# Patient Record
Sex: Female | Born: 1990 | Race: Black or African American | Hispanic: No | Marital: Single | Smoking: Never smoker
Health system: Southern US, Community
[De-identification: ages and names within clinical notes are randomized; demographics above are authoritative.]

## PROBLEM LIST (undated history)

## (undated) DIAGNOSIS — Z789 Other specified health status: Secondary | ICD-10-CM

## (undated) HISTORY — DX: Other specified health status: Z78.9

## (undated) HISTORY — PX: OTHER SURGICAL HISTORY: SHX169

---

## 2012-08-15 ENCOUNTER — Other Ambulatory Visit: Payer: Self-pay

## 2012-08-18 ENCOUNTER — Other Ambulatory Visit (HOSPITAL_COMMUNITY): Payer: Self-pay | Admitting: Obstetrics and Gynecology

## 2012-08-18 DIAGNOSIS — IMO0002 Reserved for concepts with insufficient information to code with codable children: Secondary | ICD-10-CM

## 2012-08-18 DIAGNOSIS — O358XX0 Maternal care for other (suspected) fetal abnormality and damage, not applicable or unspecified: Secondary | ICD-10-CM

## 2012-08-18 DIAGNOSIS — Q308 Other congenital malformations of nose: Secondary | ICD-10-CM

## 2012-08-21 ENCOUNTER — Ambulatory Visit (HOSPITAL_COMMUNITY)
Admission: RE | Admit: 2012-08-21 | Discharge: 2012-08-21 | Disposition: A | Discharge status: Home / Self Care | Payer: Medicaid Other | Source: Ambulatory Visit | Attending: Obstetrics and Gynecology | Admitting: Obstetrics and Gynecology

## 2012-08-21 ENCOUNTER — Encounter (HOSPITAL_COMMUNITY): Payer: Self-pay

## 2012-08-21 DIAGNOSIS — Z363 Encounter for antenatal screening for malformations: Secondary | ICD-10-CM | POA: Insufficient documentation

## 2012-08-21 DIAGNOSIS — Z1389 Encounter for screening for other disorder: Secondary | ICD-10-CM | POA: Insufficient documentation

## 2012-08-21 DIAGNOSIS — O358XX0 Maternal care for other (suspected) fetal abnormality and damage, not applicable or unspecified: Secondary | ICD-10-CM | POA: Insufficient documentation

## 2012-08-21 DIAGNOSIS — Q308 Other congenital malformations of nose: Secondary | ICD-10-CM

## 2012-08-22 NOTE — Progress Notes (Signed)
Genetic Counseling  High-Risk Gestation Note  Appointment Date:  08/21/2012 Referred By: Sherron Monday, MD Date of Birth:  [birth year]    Pregnancy History: G1P0000 Estimated Date of Delivery: 01/02/13 Estimated Gestational Age: [redacted]w[redacted]d Attending: Particia Nearing, MD   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 She was accompanied today by a friend.   We began by reviewing the ultrasound results in detail. 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 Complete ultrasound report sent separately.   An absent or hypoplastic (undeveloped, or slightly smaller than expected) nasal bone is commonly a normal variation with no associated problems. This may be a family trait and can be more common in some ethnic groups, such as the African American population.  However, an absent or hypoplastic nasal bone has been shown to increase the chance for Down syndrome in a pregnancy.   We discussed that an echogenic bowel refers to an area in the fetal intestines that appears brighter, or denser, than the liver and/or bone on ultrasound. Echogenic bowel is detected in approximately 1% of fetuses at the time of a second trimester ultrasound evaluation.  Most of the time, this brightness does not cause any problems and will spontaneously resolve.  Possible causes of an echogenic bowel include undergrowth of the bowel, an obstruction or blockage of the intestines, the fetus swallowing a small amount of blood present in the amniotic fluid, an intrauterine infection, a chromosome condition, cystic fibrosis, or a normal variation in development.   [name] reported no bleeding during the pregnancy. She also denied known exposure to infections during the pregnancy. We discussed the option of analyzing maternal TORCH titers to assess for evidence of recent infection in pregnancy.  We reviewed the limitations of this testing. We briefly reviewed cystic fibrosis (CF) including the typical features and prognosis and the autosomal recessive inheritance of the condition. The carrier frequency in the African American population is approximately 1 in 28.  [name] OB medical records indicate that she previously had negative/normal CF carrier screening for 97 mutations through her OB office, which reduced her risk to be a carrier to approximately 1 in 76. We reviewed that a normal screen reduces but does not eliminate the chance to be a carrier.  Thus, her risk to be a carrier and the risk for CF in the pregnancy have been reduced.   We reviewed chromosomes, nondisjunction, and the common and variable features and prognosis of Down syndrome (trisomy 21). We reviewed the results of [name]'s Quad screen through [employer], which reduced the risk for Down syndrome to 1 in 1593. The patient understands that Quad screen adjusts the a priori risk for the conditions screened but does not diagnose or rule out these conditions. We discussed that the ultrasound findings of absent nasal bone and echogenic bowel would increase the chance for fetal Down syndrome from the patient's Quad screen result of 1 in 1593 to approximately 0.6%. We reviewed other available screening and diagnostic options including noninvasive prenatal testing (NIPT) and amniocentesis.  Specifically, we discussed that noninvasive prenatal testing (NIPT) utilizes cell free fetal DNA found in the maternal circulation. This test is not diagnostic for chromosome conditions, but can provide information regarding the presence or absence of extra fetal DNA for chromosomes 13, 18 and 21. Thus, it would not identify or rule out all fetal aneuploidy. The reported detection rate is greater than  99% for Trisomy 21, greater than 97% for Trisomy 18, and is approximately 80% (8 out of 10) for Trisomy 13. The false positive rate is reported to be  less than 1% for any of these conditions.  We discussed the diagnostic testing option of amniocentesis. We reviewed the approximate 1 in 300-500 risk for complications, including spontaneous pregnancy loss.  We reviewed that in addition to chromosome analysis, infection studies can also be performed on amniotic fluid. We discussed the risks, limitations, and benefits of each.   After consideration of all the options, declined additional testing at the time of today's visit. She declined amniocentesis, stating she was not interested in pursuing amniocentesis at this time or in the future given the associated risk of complications. She planned to further consider NIPT ([facility]) and TORCH titers and will call our office back should she elect to pursue this testing.  She understands that ultrasound and screening cannot rule out all birth defects or genetic syndromes.    [name] was provided with written information regarding sickle cell anemia (SCA) including the carrier frequency and incidence in the African-American population, the availability of carrier testing and prenatal diagnosis if indicated.  In addition, we discussed that hemoglobinopathies are routinely screened for as part of the [place] newborn screening panel.  Hemoglobin electrophoresis was performed through her OB office and was normal, indicating that the patient does not have sickle cell trait.   Both family histories were reviewed and found to be noncontributory for birth defects, mental retardation, and known genetic conditions. Without further information regarding the provided family history, an accurate genetic risk cannot be calculated. Further genetic counseling is warranted if more information is obtained.  [name] denied exposure to environmental toxins or chemical agents. She reported a chest x ray in late June/early July at [facility] given an enlarged lymph node. She reported that it was suspected that she had an infection but  the etiology was not determined. She reported that her abdomen was shielded during the x ray. The dose of ionizing radiation is an important factor in determining the potential toxicity to a pregnancy. Available study data suggest x ray exposure at 5 rad or less is not associated with an increased risk for birth defects. The typical dose from one chest x ray is typically below this threshold. However, additional information regarding the amount of radiation used during this particular study is needed to accurately assess risk to pregnancy. She denied the use of alcohol, tobacco or street drugs. She denied significant viral illnesses during the Malone of her pregnancy. Her medical and surgical histories were otherwise noncontributory.     I counseled [name] regarding the above risks and available options.  The approximate face-to-face time with the genetic counselor was 35 minutes.    Quinn Plowman, MS,  Certified Genetic Counselor 08/22/2012

## 2014-10-25 ENCOUNTER — Encounter (HOSPITAL_COMMUNITY): Payer: Self-pay

## 2018-12-24 NOTE — L&D Delivery Note (Signed)
Delivery Note At 3:27 AM a viable female was delivered via  (Presentation: ROA ).  APGAR: 7, 9; weight 8 lb 10 oz (3912 g).   Placenta status:L&D .  Cord:  with the following complications: none .  Cord pH: n/a  Anesthesia:  epidural Episiotomy:  none Lacerations:  1st degree Suture Repair: 4-0 vicryl Est. Blood Loss (mL):  100cc  Mom to postpartum.  Baby to Couplet care / Skin to Skin.  Annalee Genta 06/16/2019, 3:49 AM

## 2019-01-09 LAB — OB RESULTS CONSOLE ANTIBODY SCREEN: Antibody Screen: NEGATIVE

## 2019-01-09 LAB — OB RESULTS CONSOLE HEPATITIS B SURFACE ANTIGEN: Hepatitis B Surface Ag: NEGATIVE

## 2019-01-09 LAB — OB RESULTS CONSOLE RUBELLA ANTIBODY, IGM: Rubella: IMMUNE

## 2019-01-09 LAB — OB RESULTS CONSOLE RPR: RPR: NONREACTIVE

## 2019-01-09 LAB — OB RESULTS CONSOLE ABO/RH: RH Type: POSITIVE

## 2019-01-09 LAB — OB RESULTS CONSOLE HIV ANTIBODY (ROUTINE TESTING): HIV: NONREACTIVE

## 2019-02-02 ENCOUNTER — Other Ambulatory Visit (HOSPITAL_COMMUNITY): Payer: Self-pay | Admitting: Obstetrics & Gynecology

## 2019-02-02 DIAGNOSIS — Z3A23 23 weeks gestation of pregnancy: Secondary | ICD-10-CM

## 2019-02-02 DIAGNOSIS — Z363 Encounter for antenatal screening for malformations: Secondary | ICD-10-CM

## 2019-02-02 DIAGNOSIS — O283 Abnormal ultrasonic finding on antenatal screening of mother: Secondary | ICD-10-CM

## 2019-03-04 ENCOUNTER — Other Ambulatory Visit (HOSPITAL_COMMUNITY): Payer: Self-pay | Admitting: *Deleted

## 2019-03-04 ENCOUNTER — Encounter (HOSPITAL_COMMUNITY): Payer: Self-pay

## 2019-03-04 ENCOUNTER — Other Ambulatory Visit: Payer: Self-pay

## 2019-03-04 ENCOUNTER — Ambulatory Visit (HOSPITAL_COMMUNITY)
Admission: RE | Admit: 2019-03-04 | Discharge: 2019-03-04 | Disposition: A | Discharge status: Home / Self Care | Payer: BC Managed Care – PPO | Source: Ambulatory Visit | Attending: Obstetrics & Gynecology | Admitting: Obstetrics & Gynecology

## 2019-03-04 ENCOUNTER — Ambulatory Visit (HOSPITAL_COMMUNITY): Payer: BC Managed Care – PPO | Admitting: *Deleted

## 2019-03-04 ENCOUNTER — Ambulatory Visit (HOSPITAL_COMMUNITY): Payer: BC Managed Care – PPO

## 2019-03-04 VITALS — BP 118/71 | HR 105 | Ht 66.5 in | Wt 152.6 lb

## 2019-03-04 DIAGNOSIS — O283 Abnormal ultrasonic finding on antenatal screening of mother: Secondary | ICD-10-CM

## 2019-03-04 DIAGNOSIS — O359XX0 Maternal care for (suspected) fetal abnormality and damage, unspecified, not applicable or unspecified: Secondary | ICD-10-CM

## 2019-03-04 DIAGNOSIS — Z363 Encounter for antenatal screening for malformations: Secondary | ICD-10-CM | POA: Insufficient documentation

## 2019-03-04 DIAGNOSIS — Z3A23 23 weeks gestation of pregnancy: Secondary | ICD-10-CM | POA: Diagnosis present

## 2019-03-04 DIAGNOSIS — N281 Cyst of kidney, acquired: Secondary | ICD-10-CM

## 2019-03-04 DIAGNOSIS — O0990 Supervision of high risk pregnancy, unspecified, unspecified trimester: Secondary | ICD-10-CM | POA: Insufficient documentation

## 2019-04-02 ENCOUNTER — Ambulatory Visit (HOSPITAL_COMMUNITY): Payer: Medicaid Other

## 2019-04-02 ENCOUNTER — Encounter (HOSPITAL_COMMUNITY): Payer: Self-pay

## 2019-05-05 ENCOUNTER — Ambulatory Visit (HOSPITAL_COMMUNITY): Payer: BC Managed Care – PPO

## 2019-05-12 ENCOUNTER — Ambulatory Visit (HOSPITAL_COMMUNITY)
Admission: RE | Admit: 2019-05-12 | Discharge: 2019-05-12 | Disposition: A | Discharge status: Home / Self Care | Payer: BC Managed Care – PPO | Source: Ambulatory Visit | Attending: Obstetrics and Gynecology | Admitting: Obstetrics and Gynecology

## 2019-05-12 ENCOUNTER — Encounter (HOSPITAL_COMMUNITY): Payer: Self-pay | Admitting: *Deleted

## 2019-05-12 ENCOUNTER — Other Ambulatory Visit: Payer: Self-pay

## 2019-05-12 ENCOUNTER — Other Ambulatory Visit (HOSPITAL_COMMUNITY): Payer: Self-pay | Admitting: *Deleted

## 2019-05-12 ENCOUNTER — Ambulatory Visit (HOSPITAL_COMMUNITY): Payer: BC Managed Care – PPO | Admitting: *Deleted

## 2019-05-12 VITALS — Temp 98.2°F

## 2019-05-12 DIAGNOSIS — Z3A33 33 weeks gestation of pregnancy: Secondary | ICD-10-CM | POA: Diagnosis not present

## 2019-05-12 DIAGNOSIS — O359XX0 Maternal care for (suspected) fetal abnormality and damage, unspecified, not applicable or unspecified: Secondary | ICD-10-CM

## 2019-05-12 DIAGNOSIS — N281 Cyst of kidney, acquired: Secondary | ICD-10-CM

## 2019-05-12 DIAGNOSIS — Z362 Encounter for other antenatal screening follow-up: Secondary | ICD-10-CM

## 2019-05-12 DIAGNOSIS — O283 Abnormal ultrasonic finding on antenatal screening of mother: Secondary | ICD-10-CM | POA: Diagnosis present

## 2019-06-09 ENCOUNTER — Telehealth (HOSPITAL_COMMUNITY): Payer: Self-pay | Admitting: *Deleted

## 2019-06-09 ENCOUNTER — Encounter (HOSPITAL_COMMUNITY): Payer: Self-pay | Admitting: *Deleted

## 2019-06-09 LAB — OB RESULTS CONSOLE GBS: GBS: NEGATIVE

## 2019-06-09 NOTE — Telephone Encounter (Signed)
Preadmission screen  

## 2019-06-10 ENCOUNTER — Encounter (HOSPITAL_COMMUNITY): Payer: Self-pay | Admitting: *Deleted

## 2019-06-10 ENCOUNTER — Ambulatory Visit (HOSPITAL_COMMUNITY): Payer: BC Managed Care – PPO

## 2019-06-10 ENCOUNTER — Encounter (HOSPITAL_COMMUNITY): Payer: Self-pay

## 2019-06-15 ENCOUNTER — Inpatient Hospital Stay (HOSPITAL_COMMUNITY)
Admission: AD | Admit: 2019-06-15 | Discharge: 2019-06-18 | DRG: 807 | Disposition: A | Discharge status: Home / Self Care | Payer: BC Managed Care – PPO | Attending: Obstetrics & Gynecology | Admitting: Obstetrics & Gynecology

## 2019-06-15 ENCOUNTER — Other Ambulatory Visit: Payer: Self-pay

## 2019-06-15 DIAGNOSIS — Z3A38 38 weeks gestation of pregnancy: Secondary | ICD-10-CM | POA: Diagnosis not present

## 2019-06-15 DIAGNOSIS — D649 Anemia, unspecified: Secondary | ICD-10-CM | POA: Diagnosis present

## 2019-06-15 DIAGNOSIS — O9902 Anemia complicating childbirth: Secondary | ICD-10-CM | POA: Diagnosis present

## 2019-06-15 DIAGNOSIS — Z1159 Encounter for screening for other viral diseases: Secondary | ICD-10-CM

## 2019-06-15 DIAGNOSIS — O26893 Other specified pregnancy related conditions, third trimester: Secondary | ICD-10-CM | POA: Diagnosis present

## 2019-06-15 DIAGNOSIS — O358XX0 Maternal care for other (suspected) fetal abnormality and damage, not applicable or unspecified: Secondary | ICD-10-CM | POA: Diagnosis present

## 2019-06-15 NOTE — MAU Note (Signed)
Pt c/o contractions that started at 2100. Now 5 mins apart, denies LOF or VB . +FM. Was closed at last PNV.

## 2019-06-16 ENCOUNTER — Inpatient Hospital Stay (HOSPITAL_COMMUNITY): Payer: BC Managed Care – PPO | Admitting: Anesthesiology

## 2019-06-16 ENCOUNTER — Encounter (HOSPITAL_COMMUNITY): Payer: Self-pay | Admitting: Anesthesiology

## 2019-06-16 LAB — CBC
HCT: 35.3 % — ABNORMAL LOW (ref 36.0–46.0)
Hemoglobin: 11.5 g/dL — ABNORMAL LOW (ref 12.0–15.0)
MCH: 30 pg (ref 26.0–34.0)
MCHC: 32.6 g/dL (ref 30.0–36.0)
MCV: 92.2 fL (ref 80.0–100.0)
Platelets: 219 10*3/uL (ref 150–400)
RBC: 3.83 MIL/uL — ABNORMAL LOW (ref 3.87–5.11)
RDW: 14.5 % (ref 11.5–15.5)
WBC: 9.3 10*3/uL (ref 4.0–10.5)
nRBC: 0 % (ref 0.0–0.2)

## 2019-06-16 LAB — RPR: RPR Ser Ql: NONREACTIVE

## 2019-06-16 LAB — ABO/RH: ABO/RH(D): A POS

## 2019-06-16 LAB — TYPE AND SCREEN
ABO/RH(D): A POS
Antibody Screen: NEGATIVE

## 2019-06-16 LAB — SARS CORONAVIRUS 2 BY RT PCR (HOSPITAL ORDER, PERFORMED IN ~~LOC~~ HOSPITAL LAB): SARS Coronavirus 2: NEGATIVE

## 2019-06-16 MED ORDER — ONDANSETRON HCL 4 MG PO TABS: 4.0000 mg | ORAL_TABLET | ORAL | Status: DC | PRN

## 2019-06-16 MED ORDER — PRENATAL MULTIVITAMIN CH
1.0000 | ORAL_TABLET | Freq: Every day | ORAL | Status: DC
  Administered 2019-06-16 – 2019-06-18 (×3): 1 via ORAL
  Filled 2019-06-16 (×3): qty 1

## 2019-06-16 MED ORDER — LIDOCAINE HCL (PF) 1 % IJ SOLN
INTRAMUSCULAR | Status: DC | PRN
  Administered 2019-06-16 (×2): 4 mL via EPIDURAL

## 2019-06-16 MED ORDER — ONDANSETRON HCL 4 MG/2ML IJ SOLN: 4.0000 mg | Freq: Four times a day (QID) | INTRAMUSCULAR | Status: DC | PRN

## 2019-06-16 MED ORDER — LIDOCAINE HCL (PF) 1 % IJ SOLN: 30.0000 mL | INTRAMUSCULAR | Status: DC | PRN

## 2019-06-16 MED ORDER — DIPHENHYDRAMINE HCL 50 MG/ML IJ SOLN: 12.5000 mg | INTRAMUSCULAR | Status: DC | PRN

## 2019-06-16 MED ORDER — DIPHENHYDRAMINE HCL 25 MG PO CAPS: 25.0000 mg | ORAL_CAPSULE | Freq: Four times a day (QID) | ORAL | Status: DC | PRN

## 2019-06-16 MED ORDER — SOD CITRATE-CITRIC ACID 500-334 MG/5ML PO SOLN: 30.0000 mL | ORAL | Status: DC | PRN

## 2019-06-16 MED ORDER — WITCH HAZEL-GLYCERIN EX PADS: 1.0000 "application " | MEDICATED_PAD | CUTANEOUS | Status: DC | PRN

## 2019-06-16 MED ORDER — COCONUT OIL OIL: 1.0000 "application " | TOPICAL_OIL | Status: DC | PRN

## 2019-06-16 MED ORDER — LACTATED RINGERS IV SOLN
500.0000 mL | Freq: Once | INTRAVENOUS | Status: AC
  Administered 2019-06-16: 01:00:00 500 mL via INTRAVENOUS

## 2019-06-16 MED ORDER — SENNOSIDES-DOCUSATE SODIUM 8.6-50 MG PO TABS
2.0000 | ORAL_TABLET | ORAL | Status: DC
  Administered 2019-06-16 – 2019-06-18 (×2): 2 via ORAL
  Filled 2019-06-16 (×2): qty 2

## 2019-06-16 MED ORDER — OXYTOCIN 40 UNITS IN NORMAL SALINE INFUSION - SIMPLE MED
INTRAVENOUS | Status: AC
  Filled 2019-06-16: qty 1000

## 2019-06-16 MED ORDER — ACETAMINOPHEN 325 MG PO TABS
650.0000 mg | ORAL_TABLET | ORAL | Status: DC | PRN
  Administered 2019-06-17 – 2019-06-18 (×2): 650 mg via ORAL
  Filled 2019-06-16 (×2): qty 2

## 2019-06-16 MED ORDER — OXYCODONE-ACETAMINOPHEN 5-325 MG PO TABS: 1.0000 | ORAL_TABLET | ORAL | Status: DC | PRN

## 2019-06-16 MED ORDER — OXYCODONE-ACETAMINOPHEN 5-325 MG PO TABS: 2.0000 | ORAL_TABLET | ORAL | Status: DC | PRN

## 2019-06-16 MED ORDER — BUTORPHANOL TARTRATE 1 MG/ML IJ SOLN
INTRAMUSCULAR | Status: AC
  Filled 2019-06-16: qty 1

## 2019-06-16 MED ORDER — PHENYLEPHRINE 40 MCG/ML (10ML) SYRINGE FOR IV PUSH (FOR BLOOD PRESSURE SUPPORT): 80.0000 ug | PREFILLED_SYRINGE | INTRAVENOUS | Status: DC | PRN

## 2019-06-16 MED ORDER — FLEET ENEMA 7-19 GM/118ML RE ENEM: 1.0000 | ENEMA | RECTAL | Status: DC | PRN

## 2019-06-16 MED ORDER — SODIUM CHLORIDE (PF) 0.9 % IJ SOLN
INTRAMUSCULAR | Status: DC | PRN
  Administered 2019-06-16: 12 mL/h via EPIDURAL

## 2019-06-16 MED ORDER — EPHEDRINE 5 MG/ML INJ: 10.0000 mg | INTRAVENOUS | Status: DC | PRN

## 2019-06-16 MED ORDER — BUTORPHANOL TARTRATE 1 MG/ML IJ SOLN
1.0000 mg | Freq: Once | INTRAMUSCULAR | Status: AC
  Administered 2019-06-16: 01:00:00 1 mg via INTRAVENOUS

## 2019-06-16 MED ORDER — ZOLPIDEM TARTRATE 5 MG PO TABS: 5.0000 mg | ORAL_TABLET | Freq: Every evening | ORAL | Status: DC | PRN

## 2019-06-16 MED ORDER — DIBUCAINE (PERIANAL) 1 % EX OINT: 1.0000 "application " | TOPICAL_OINTMENT | CUTANEOUS | Status: DC | PRN

## 2019-06-16 MED ORDER — LACTATED RINGERS IV SOLN: 500.0000 mL | Freq: Once | INTRAVENOUS | Status: DC

## 2019-06-16 MED ORDER — ONDANSETRON HCL 4 MG/2ML IJ SOLN: 4.0000 mg | INTRAMUSCULAR | Status: DC | PRN

## 2019-06-16 MED ORDER — OXYTOCIN 40 UNITS IN NORMAL SALINE INFUSION - SIMPLE MED: 2.5000 [IU]/h | INTRAVENOUS | Status: DC

## 2019-06-16 MED ORDER — ACETAMINOPHEN 325 MG PO TABS: 650.0000 mg | ORAL_TABLET | ORAL | Status: DC | PRN

## 2019-06-16 MED ORDER — IBUPROFEN 600 MG PO TABS
600.0000 mg | ORAL_TABLET | Freq: Four times a day (QID) | ORAL | Status: DC
  Administered 2019-06-16 – 2019-06-18 (×9): 600 mg via ORAL
  Filled 2019-06-16 (×9): qty 1

## 2019-06-16 MED ORDER — LACTATED RINGERS IV SOLN
INTRAVENOUS | Status: DC
  Administered 2019-06-16 (×3): via INTRAVENOUS

## 2019-06-16 MED ORDER — LACTATED RINGERS IV SOLN
500.0000 mL | INTRAVENOUS | Status: DC | PRN
  Administered 2019-06-16: 500 mL via INTRAVENOUS

## 2019-06-16 MED ORDER — SIMETHICONE 80 MG PO CHEW: 80.0000 mg | CHEWABLE_TABLET | ORAL | Status: DC | PRN

## 2019-06-16 MED ORDER — FENTANYL-BUPIVACAINE-NACL 0.5-0.125-0.9 MG/250ML-% EP SOLN: 12.0000 mL/h | EPIDURAL | Status: DC | PRN

## 2019-06-16 MED ORDER — OXYTOCIN BOLUS FROM INFUSION
500.0000 mL | Freq: Once | INTRAVENOUS | Status: AC
  Administered 2019-06-16: 500 mL via INTRAVENOUS

## 2019-06-16 MED ORDER — BENZOCAINE-MENTHOL 20-0.5 % EX AERO: 1.0000 "application " | INHALATION_SPRAY | CUTANEOUS | Status: DC | PRN

## 2019-06-16 MED ORDER — FENTANYL-BUPIVACAINE-NACL 0.5-0.125-0.9 MG/250ML-% EP SOLN
EPIDURAL | Status: AC
  Filled 2019-06-16: qty 250

## 2019-06-16 NOTE — Progress Notes (Signed)
OB PN:  S: s/p epidural pain improved from 9/10 to 5/10- still feeling some contractions  O: BP 117/76   Pulse 74   Temp 97.8 F (36.6 C) (Oral)   Resp 20   Ht 5\' 7"  (1.702 m)   Wt 82.3 kg   LMP 09/20/2018   SpO2 100%   BMI 28.41 kg/m   FHT: 125bpm, moderate variablity, + accels, variable decel x 1 with AROM- no further decels noted Toco: q39min SVE: 9/100/-1  A/P: 28 y.o. G2P1001 @ [redacted]w[redacted]d in active labor 1. FWB: Cat.I 2. Labor: expectant management Pain: continue epidural GBS: negative  Janyth Pupa, DO [phone] (cell) [phone] (office)

## 2019-06-16 NOTE — Anesthesia Procedure Notes (Signed)
Epidural Patient location during procedure: OB Start time: 06/16/2019 1:03 AM End time: 06/16/2019 1:11 AM  Staffing Anesthesiologist: Josephine Igo, MD Performed: anesthesiologist   Preanesthetic Checklist Completed: patient identified, site marked, surgical consent, pre-op evaluation, timeout performed, IV checked, risks and benefits discussed and monitors and equipment checked  Epidural Patient position: sitting Prep: site prepped and draped and DuraPrep Patient monitoring: continuous pulse ox and blood pressure Approach: midline Location: L3-L4 Injection technique: LOR air  Needle:  Needle type: Tuohy  Needle gauge: 17 G Needle length: 9 cm and 9 Needle insertion depth: 5 cm cm Catheter type: closed end flexible Catheter size: 19 Gauge Catheter at skin depth: 10 cm Test dose: negative and Other  Assessment Events: blood not aspirated, injection not painful, no injection resistance, negative IV test and no paresthesia  Additional Notes Patient identified. Risks and benefits discussed including failed block, incomplete  Pain control, post dural puncture headache, nerve damage, paralysis, blood pressure Changes, nausea, vomiting, reactions to medications-both toxic and allergic and post Partum back pain. All questions were answered. Patient expressed understanding and wished to proceed. Sterile technique was used throughout procedure. Epidural site was Dressed with sterile barrier dressing. No paresthesias, signs of intravascular injection Or signs of intrathecal spread were encountered.  Patient was more comfortable after the epidural was dosed. Please see RN's note for documentation of vital signs and FHR which are stable. Reason for block:procedure for pain

## 2019-06-16 NOTE — H&P (Signed)
HPI: 28 y/o G2P1001 @ [redacted]w[redacted]d estimated gestational age (as dated by LMP c/w 20 week ultrasound) presents for painful contractions.   no Leaking of Fluid,   no Vaginal Bleeding,   + Uterine Contractions,  + Fetal Movement.  Prenatal care has been provided by Dr. Nelda Marseille  ROS: no HA, no epigastric pain, no visual changes.    Pregnancy complicated by: 1) Fetal renal cyst- followed by serial [place] and MFM -last [place] @ [redacted]w[redacted]d-vertex, EFW 8#2oz (>90%), Left renal cyst 7.7cm   Prenatal Transfer Tool  Maternal Diabetes: No Genetic Screening: Normal Maternal Ultrasounds/Referrals: Normal Fetal Ultrasounds or other Referrals:  Referred to Materal Fetal Medicine  Maternal Substance Abuse:  No Significant Maternal Medications:  None Significant Maternal Lab Results: Group B Strep negative   PNL:  GBS neg, Rub Immune, Hep B neg, RPR NR, HIV neg, GC/C neg, glucola:normal Hgb: Blood type: A positive, antibody neg  Immunizations: Tdap: 04/01/2019 Declined flu shot  OBHx: 2004- FTSNVD, 7#12oz PMHx:  none Meds:  PNV Allergy:  No Known Allergies SurgHx: none SocHx:   no Tobacco, no  EtOH, no Illicit Drugs  O: BP 681/15 (BP Location: Right Arm)   Pulse 75   Temp 98.4 F (36.9 C) (Oral)   Resp 18   Ht 5\' 7"  (1.702 m)   Wt 82.3 kg   LMP 09/20/2018   SpO2 99%   BMI 28.41 kg/m  Gen. AAOx3, NAD CV.  RRR  No murmur.  Resp. CTAB, no wheeze or crackles. Abd. Gravid,  no tenderness,  no rigidity,  no guarding Extr.  no edema B/L , no calf tenderness, neg Homan's B/L FHT: 120 baseline, moderate variability, + accels,  no decels Toco: q 2 min SVE: deferred, per MAU 7-8cm   Labs: see orders  A/P:  28 y.o. G2P1001 @ [redacted]w[redacted]d EGA admitted in active labor -FWB:  NICHD Cat I FHTs -Labor: expectant management -GBS: negative -Pain management: IV x 1 now, plan for epidural -Fetal renal cyst- will notify Crisman, DO [phone] (cell) [phone] (office)

## 2019-06-16 NOTE — Anesthesia Preprocedure Evaluation (Signed)
Anesthesia Evaluation  Patient identified by MRN, date of birth, ID band Patient awake    Reviewed: Allergy & Precautions, Patient's Chart, lab work & pertinent test results  Airway Mallampati: II  TM Distance: >3 FB Neck ROM: Full    Dental no notable dental hx. (+) Teeth Intact   Pulmonary neg pulmonary ROS,    Pulmonary exam normal breath sounds clear to auscultation       Cardiovascular negative cardio ROS Normal cardiovascular exam Rhythm:Regular Rate:Normal     Neuro/Psych negative neurological ROS  negative psych ROS   GI/Hepatic Neg liver ROS, GERD  ,  Endo/Other  negative endocrine ROS  Renal/GU negative Renal ROS  negative genitourinary   Musculoskeletal negative musculoskeletal ROS (+)   Abdominal   Peds  Hematology  (+) anemia ,   Anesthesia Other Findings   Reproductive/Obstetrics (+) Pregnancy                             Anesthesia Physical Anesthesia Plan  ASA: II  Anesthesia Plan: Epidural   Post-op Pain Management:    Induction:   PONV Risk Score and Plan:   Airway Management Planned: Natural Airway  Additional Equipment:   Intra-op Plan:   Post-operative Plan:   Informed Consent: I have reviewed the patients History and Physical, chart, labs and discussed the procedure including the risks, benefits and alternatives for the proposed anesthesia with the patient or authorized representative who has indicated his/her understanding and acceptance.       Plan Discussed with: Anesthesiologist  Anesthesia Plan Comments:         Anesthesia Quick Evaluation  

## 2019-06-16 NOTE — Anesthesia Postprocedure Evaluation (Signed)
Anesthesia Post Note  Patient: Sara Malone  Procedure(s) Performed: AN AD Gaffney     Patient location during evaluation: Mother Baby Anesthesia Type: Epidural Level of consciousness: awake and alert and oriented Pain management: satisfactory to patient Vital Signs Assessment: post-procedure vital signs reviewed and stable Respiratory status: respiratory function stable Cardiovascular status: stable Postop Assessment: no headache, no backache, epidural receding, patient able to bend at knees, no signs of nausea or vomiting and adequate PO intake Anesthetic complications: no    Last Vitals:  Vitals:   06/16/19 0635 06/16/19 0725  BP: 124/78 121/69  Pulse: 68 73  Resp: 18 18  Temp: 36.7 C 37 C  SpO2:  98%    Last Pain:  Vitals:   06/16/19 0725  TempSrc: Oral  PainSc: 0-No pain   Pain Goal:                   Sara Malone

## 2019-06-16 NOTE — MAU Note (Signed)
Pt transferred via bed to rm 213.

## 2019-06-16 NOTE — Lactation Note (Signed)
This note was copied from a baby's chart. Lactation Consultation Note  Patient Name: Boy Madysin Crisp [id number] Date: 06/16/2019 Reason for consult: Initial assessment;Early term 37-38.6wks P2. Baby is 8 hours old and has latched once for 5 minutes.  Mom states first baby would not latch and she had low production.  She did not have any lactation support.  Breasts are very heavy.  Areolar tissue is firm and edematous.  Nipples short.  Rolled cloth diaper placed under breast.  Hand expression reviewed with mom but no colostrum seen.  Reverse pressure done.  Baby placed in cross cradle hold on right breast.  With teacup hold baby latched for a few minutes but could not sustain the latch.  Mom given breast shells and manual pump.  Left breast pre pumped.  Baby latched to left breast for a few more minutes and then he fell asleep.  Hopefully breast shells and pump will make tissue more compressible.  Instructed to feed with any cue and call for assist prn.  Breastfeeding consultation services and support information given and reviewed.  Maternal Data Has patient been taught Hand Expression?: Yes Does the patient have breastfeeding experience prior to this delivery?: Yes  Feeding Feeding Type: Breast Fed  LATCH Score Latch: Repeated attempts needed to sustain latch, nipple held in mouth throughout feeding, stimulation needed to elicit sucking reflex.  Audible Swallowing: None  Type of Nipple: Everted at rest and after stimulation(areolar edema)  Comfort (Breast/Nipple): Soft / non-tender  Hold (Positioning): Assistance needed to correctly position infant at breast and maintain latch.  LATCH Score: 6  Interventions Interventions: Assisted with latch;Adjust position;Breast compression;Skin to skin;Support pillows;Breast massage;Position options;Hand express;Pre-pump if needed;Reverse pressure;Shells;Hand pump  Lactation Tools Discussed/Used Tools: Shells Shell Type: Inverted Pump Review:  Setup, frequency, and cleaning Initiated by:: LMoulden Date initiated:: 06/16/19   Consult Status Consult Status: Follow-up Date: 06/17/19 Follow-up type: In-patient    Ave Filter 06/16/2019, 12:31 PM

## 2019-06-17 LAB — CBC
HCT: 31.8 % — ABNORMAL LOW (ref 36.0–46.0)
Hemoglobin: 10.3 g/dL — ABNORMAL LOW (ref 12.0–15.0)
MCH: 29.9 pg (ref 26.0–34.0)
MCHC: 32.4 g/dL (ref 30.0–36.0)
MCV: 92.2 fL (ref 80.0–100.0)
Platelets: 236 10*3/uL (ref 150–400)
RBC: 3.45 MIL/uL — ABNORMAL LOW (ref 3.87–5.11)
RDW: 14.5 % (ref 11.5–15.5)
WBC: 12 10*3/uL — ABNORMAL HIGH (ref 4.0–10.5)
nRBC: 0 % (ref 0.0–0.2)

## 2019-06-17 NOTE — Progress Notes (Signed)
Postpartum Note Day # 1  S:  Patient resting comfortable in bed.  Pain controlled.  Tolerating general diet. + flatus, no BM.  Lochia moderate.  Ambulating without difficulty.  She denies n/v/f/c, SOB, or CP.  Pt plans on breastfeeding, but still not expressing milk  O: Temp:  [98.6 F (37 C)-99.5 F (37.5 C)] 98.6 F (37 C) (06/24 0515) Pulse Rate:  [73-92] 92 (06/24 0515) Resp:  [16-18] 18 (06/24 0515) BP: (116-129)/(64-76) 117/64 (06/24 0515) SpO2:  [98 %-99 %] 99 % (06/24 0515)   Gen: A&Ox3, NAD CV: RRR Resp: CTAB Abdomen: soft, NT, ND Uterus: firm, non-tender, below umbilicus Ext: No edema, no calf tenderness bilaterally, SCDs in place  Labs:  Recent Labs    06/16/19 0020 06/17/19 0528  HGB 11.5* 10.3*    A/P: Pt is a 28 y.o. [id number] s/p NSVD, PPD#1  - Pain well controlled -GU: voiding freely -GI: Tolerating general diet -Activity: encouraged sitting up to chair and ambulation as tolerated -Prophylaxis: early ambulation -Labs: stable as above  Continue routine postpartum care, possible discharge home today  Janyth Pupa, DO [phone] (cell) [phone] (office)

## 2019-06-17 NOTE — Lactation Note (Signed)
This note was copied from a baby's chart. Lactation Consultation Note  Patient Name: Sara Malone [id number] Date: 06/17/2019 Reason for consult: Follow-up assessment;Difficult latch;Primapara;Early term 54-38.6wks Baby is 33 hours/6% weight loss.  Baby is latching for a few sucks at a time but not sustaining latch.  Mom is wearing shells and pre pumping with manual pump.  Areolar tissue remains firm.  Assisted with positioning baby in football hold on the right side.  Baby sleepy and waking techniques needed.  Baby latched briefly but unable to sustain latch.  A 24 mm nipple shield applied and baby latched well and fed off and on for 10 minutes.  He was very sleepy during feeding and needed stimulation.  Colostrum in shield when baby came off.  Mom pleased with feeding.  Symphony pump set up and initiated.  Instructed to pump every 3 hours x 15 minutes.  Instructed to call for assist with spoon or syringe feeding if milk obtained.  Questions answered.  Maternal Data    Feeding Feeding Type: Breast Fed  LATCH Score Latch: Grasps breast easily, tongue down, lips flanged, rhythmical sucking.(with nipple shield)  Audible Swallowing: A few with stimulation  Type of Nipple: Everted at rest and after stimulation  Comfort (Breast/Nipple): Soft / non-tender  Hold (Positioning): Assistance needed to correctly position infant at breast and maintain latch.  LATCH Score: 8  Interventions Interventions: Assisted with latch;Pre-pump if needed;Hand express;Breast massage;Skin to skin;Support pillows;Adjust position;Shells;Reverse pressure  Lactation Tools Discussed/Used Tools: Nipple Shields Nipple shield size: 24 Pump Review: Setup, frequency, and cleaning Initiated by:: LMoulden Date initiated:: 06/17/19   Consult Status Consult Status: Follow-up Date: 06/18/19 Follow-up type: In-patient    Ave Filter 06/17/2019, 12:36 PM

## 2019-06-17 NOTE — Discharge Instructions (Signed)
Vaginal Delivery, Care After °Refer to this sheet in the next few weeks. These instructions provide you with information about caring for yourself after vaginal delivery. Your health care provider may also give you more specific instructions. Your treatment has been planned according to current medical practices, but problems sometimes occur. Call your health care provider if you have any problems or questions. °What can I expect after the procedure? °After vaginal delivery, it is common to have: °· Some bleeding from your vagina. °· Soreness in your abdomen, your vagina, and the area of skin between your vaginal opening and your anus (perineum). °· Pelvic cramps. °· Fatigue. °Follow these instructions at home: °Medicines °· Take over-the-counter and prescription medicines only as told by your health care provider. °· If you were prescribed an antibiotic medicine, take it as told by your health care provider. Do not stop taking the antibiotic until it is finished. °Driving ° °· Do not drive or operate heavy machinery while taking prescription pain medicine. °· Do not drive for 24 hours if you received a sedative. °Lifestyle °· Do not drink alcohol. This is especially important if you are breastfeeding or taking medicine to relieve pain. °· Do not use tobacco products, including cigarettes, chewing tobacco, or e-cigarettes. If you need help quitting, ask your health care provider. °Eating and drinking °· Drink at least 8 eight-ounce glasses of water every day unless you are told not to by your health care provider. If you choose to breastfeed your baby, you may need to drink more water than this. °· Eat high-fiber foods every day. These foods may help prevent or relieve constipation. High-fiber foods include: °? Whole grain cereals and breads. °? Brown rice. °? Beans. °? Fresh fruits and vegetables. °Activity °· Return to your normal activities as told by your health care provider. Ask your health care provider what  activities are safe for you. °· Rest as much as possible. Try to rest or take a nap when your baby is sleeping. °· Do not lift anything that is heavier than your baby or 10 lb (4.5 kg) until your health care provider says that it is safe. °· Talk with your health care provider about when you can engage in sexual activity. This may depend on your: °? Risk of infection. °? Rate of healing. °? Comfort and desire to engage in sexual activity. °Vaginal Care °· If you have an episiotomy or a vaginal tear, check the area every day for signs of infection. Check for: °? More redness, swelling, or pain. °? More fluid or blood. °? Warmth. °? Pus or a bad smell. °· Do not use tampons or douches until your health care provider says this is safe. °· Watch for any blood clots that may pass from your vagina. These may look like clumps of dark red, brown, or black discharge. °General instructions °· Keep your perineum clean and dry as told by your health care provider. °· Wear loose, comfortable clothing. °· Wipe from front to back when you use the toilet. °· Ask your health care provider if you can shower or take a bath. If you had an episiotomy or a perineal tear during labor and delivery, your health care provider may tell you not to take baths for a certain length of time. °· Wear a bra that supports your breasts and fits you well. °· If possible, have someone help you with household activities and help care for your baby for at least a few days after you   leave the hospital. °· Keep all follow-up visits for you and your baby as told by your health care provider. This is important. °Contact a health care provider if: °· You have: °? Vaginal discharge that has a bad smell. °? Difficulty urinating. °? Pain when urinating. °? A sudden increase or decrease in the frequency of your bowel movements. °? More redness, swelling, or pain around your episiotomy or vaginal tear. °? More fluid or blood coming from your episiotomy or vaginal  tear. °? Pus or a bad smell coming from your episiotomy or vaginal tear. °? A fever. °? A rash. °? Little or no interest in activities you used to enjoy. °? Questions about caring for yourself or your baby. °· Your episiotomy or vaginal tear feels warm to the touch. °· Your episiotomy or vaginal tear is separating or does not appear to be healing. °· Your breasts are painful, hard, or turn red. °· You feel unusually sad or worried. °· You feel nauseous or you vomit. °· You pass large blood clots from your vagina. If you pass a blood clot from your vagina, save it to show to your health care provider. Do not flush blood clots down the toilet without having your health care provider look at them. °· You urinate more than usual. °· You are dizzy or light-headed. °· You have not breastfed at all and you have not had a menstrual period for 12 weeks after delivery. °· You have stopped breastfeeding and you have not had a menstrual period for 12 weeks after you stopped breastfeeding. °Get help right away if: °· You have: °? Pain that does not go away or does not get better with medicine. °? Chest pain. °? Difficulty breathing. °? Blurred vision or spots in your vision. °? Thoughts about hurting yourself or your baby. °· You develop pain in your abdomen or in one of your legs. °· You develop a severe headache. °· You faint. °· You bleed from your vagina so much that you fill two sanitary pads in one hour. °This information is not intended to replace advice given to you by your health care provider. Make sure you discuss any questions you have with your health care provider. °Document Released: 12/07/2000 Document Revised: 05/23/2016 Document Reviewed: 12/25/2015 °Elsevier Interactive Patient Education © 2019 [employer] ° °

## 2019-06-18 ENCOUNTER — Other Ambulatory Visit (HOSPITAL_COMMUNITY)
Admission: RE | Admit: 2019-06-18 | Discharge: 2019-06-18 | Disposition: A | Discharge status: Home / Self Care | Payer: BC Managed Care – PPO | Source: Ambulatory Visit

## 2019-06-18 MED ORDER — TRAMADOL HCL 50 MG PO TABS
50.0000 mg | ORAL_TABLET | Freq: Four times a day (QID) | ORAL | Status: DC | PRN
  Administered 2019-06-18: 12:00:00 50 mg via ORAL
  Filled 2019-06-18: qty 1

## 2019-06-18 MED ORDER — IBUPROFEN 600 MG PO TABS: 600.0000 mg | ORAL_TABLET | Freq: Four times a day (QID) | ORAL | 0 refills | Status: AC

## 2019-06-18 MED ORDER — TRAMADOL HCL 50 MG PO TABS: 50.0000 mg | ORAL_TABLET | Freq: Four times a day (QID) | ORAL | 0 refills | Status: AC | PRN

## 2019-06-18 NOTE — Progress Notes (Signed)
Postpartum Note Day # 2  S:  This am she is having considerable breastfeeding concerns- lactation at bedside.  She has edema/engorgement, but is still not producing milk.  Notes breast tenderness bilaterally.  She does have moderate cramping/pelvic pain due to all the breast stimulation.  Tolerating general diet. + flatus, no BM.  Lochia moderate.  Ambulating without difficulty.  She denies n/v/f/c, SOB, or CP.    O: Temp:  [98.1 F (36.7 C)-99 F (37.2 C)] 98.7 F (37.1 C) (06/25 0624) Pulse Rate:  [68-93] 68 (06/25 0624) Resp:  [16-18] 18 (06/25 0624) BP: (112-129)/(61-80) 112/65 (06/25 0624) SpO2:  [97 %-100 %] 97 % (06/25 0624)   Gen: A&Ox3, NAD CV: RRR Resp: CTAB Abdomen: soft, NT, ND Uterus: firm, non-tender, below umbilicus Ext: No edema, no calf tenderness bilaterally  Labs:  Recent Labs    06/16/19 0020 06/17/19 0528  HGB 11.5* 10.3*    A/P: Pt is a 28 y.o. [id number] s/p NSVD, PPD#2  - Ultram added as additional pain management, continue with round the clock motrin and tylenol as needed -GU: voiding freely -GI: Tolerating general diet -Activity: encouraged sitting up to chair and ambulation as tolerated -Prophylaxis: early ambulation -Labs: stable as above  Continue routine postpartum care, plan for discharge home today  Janyth Pupa, DO [phone] (cell) [phone] (office)

## 2019-06-18 NOTE — Lactation Note (Signed)
This note was copied from a baby's chart. Lactation Consultation Note Baby 45 hrs old. Baby w/10% wt. Loss. Baby BF w/#24 NS. Mom states nothing noted in NS. Mom engorged. Mom stated the baby needs to be feeding before she is to be d/c home. Moms breast has knots, nipples and breast not compressible.  Reverse pressure not helpful. Hand expressed only showed a little glistening. Unable to collect colostrum. Laid mom flat w/ICE to breast. Mom pumped w/nothing noted.  Discussed importance of mom getting her milk to flow. Reported to on coming [place].   Patient Name: Sara Malone IPJAS'N Date: 06/18/2019 Reason for consult: Mother's request;Engorgement   Maternal Data Has patient been taught Hand Expression?: Yes Does the patient have breastfeeding experience prior to this delivery?: Yes  Feeding Feeding Type: Breast Fed  LATCH Score          Comfort (Breast/Nipple): Engorged, cracked, bleeding, large blisters, severe discomfort        Interventions Interventions: DEBP;Reverse pressure;Breast compression;Hand express;Breast massage  Lactation Tools Discussed/Used Tools: Nipple Shields Nipple shield size: 24 Pump Review: Setup, frequency, and cleaning;Milk Storage   Consult Status Consult Status: Follow-up Date: 06/18/19 Follow-up type: In-patient    Theodoro Kalata 06/18/2019, 7:12 AM

## 2019-06-18 NOTE — Lactation Note (Signed)
This note was copied from a baby's chart. Lactation Consultation Note  Patient Name: Sara Malone MVEHM'C Date: 06/18/2019 Reason for consult: Follow-up assessment  Mom has a history of low milk supply, weight loss in baby 10%, difficult latch  Visited with P2 Mom of [name] at 61 hrs old.  Mom has very full/slightly engorged breasts with painful "knots".  Mom using cabbage leaves on her breast for last 30 mins, before that she had ice packs on for 20 mins.   Baby with a 10% weight loss today, output 2 voids and 1 stool last 24 hrs.  Baby noted to be slightly jittery.  RN aware of this.   Mom has been using the nipple shield to help with latching baby.  Baby has been latching often, but Mom unsure if baby is swallowing.  Mom only hearing sucking when he is latched.  Reviewed breast massage and hand expression, drop of transitional milk noted from right breast.    Baby swaddled in crib sleeping, last feeding 4 hrs ago.  Talked about keeping baby STS more and encouraging more feedings.  Mom needed a lot of guidance with positioning and latching.  Mom using opposite hands when trying to latch in football hold.  Reviewed hand placement on breast, and hand placement on baby's head.    Baby opened wide and took a few suck, but falls asleep repeatedly.  No milk noted in shield.  Baby fussy when taken off breast.   LC recommended formula supplementation if unable to express enough breast milk.  Set Mom up to double pump for 20 mins.  Massaged breasts periodically during pumping.  Drops of milk noted, but nothing in bottle.  Demonstrated cup feeding as a method of supplementation.  Baby took 20 ml formula well.  FOB return demonstrated well.  Baby contented following supplement.  Plan written and given to Mom.  Plan- 1- Keep baby STS as much as possible 2- Feed baby every 3 hrs or sooner (due to weight loss) if baby cues 3- Breast feed using nipple shield to help with latch 4- Supplement with  20-30 ml EBM+/formula by cup, syringe, pace bottle 5- Pump both breasts 20 mins (adding breast massage and warm compresses prior to pumping)  Request made for Lactation follow-up after discharge.   Maternal Data Has patient been taught Hand Expression?: Yes Does the patient have breastfeeding experience prior to this delivery?: Yes  Feeding Feeding Type: Formula  LATCH Score Latch: Repeated attempts needed to sustain latch, nipple held in mouth throughout feeding, stimulation needed to elicit sucking reflex.  Audible Swallowing: None  Type of Nipple: Everted at rest and after stimulation(short shafts)  Comfort (Breast/Nipple): Filling, red/small blisters or bruises, mild/mod discomfort  Hold (Positioning): Full assist, staff holds infant at breast  LATCH Score: 4  Interventions Interventions: Breast feeding basics reviewed;Assisted with latch;Skin to skin;Breast massage;Hand express;Pre-pump if needed;Breast compression;Adjust position;Support pillows;Position options;Expressed milk;Shells;Hand pump;DEBP  Lactation Tools Discussed/Used Tools: Feeding cup Nipple shield size: 24 Shell Type: Inverted Breast pump type: Double-Electric Breast Pump Pump Review: Setup, frequency, and cleaning;Milk Storage   Consult Status Consult Status: Complete Date: 06/18/19 Follow-up type: [place], Sara Malone 06/18/2019, 9:47 AM

## 2019-06-22 ENCOUNTER — Inpatient Hospital Stay (HOSPITAL_COMMUNITY): Payer: BC Managed Care – PPO

## 2019-06-22 NOTE — Discharge Summary (Signed)
OB Discharge Summary     Patient Name: Sara Malone DOB: June 06, 1991 MRN: [id number]  Date of admission: 06/15/2019 Delivering MD: Janyth Pupa   Date of discharge: 06/22/2019  Admitting diagnosis: 38 WKS, CTX Intrauterine pregnancy: [redacted]w[redacted]d     Secondary diagnosis:  Active Problems:   Normal labor  Additional problems: Fetal renal cyst     Discharge diagnosis: Term Pregnancy Delivered                                                                                                Post partum procedures:none  Augmentation: n/a  Complications: None  Hospital course:  Onset of Labor With Vaginal Delivery     28 y.o. yo [id number] at [redacted]w[redacted]d was admitted in Active Labor on 06/15/2019. Patient had an uncomplicated labor course as follows:  Membrane Rupture Time/Date: 1:40 AM ,06/16/2019   Intrapartum Procedures: Episiotomy:                                          Lacerations:  1st degree [2]  Patient had a delivery of a Viable infant. 06/16/2019  Information for the patient's newborn:  Lindwood Coke [[id number]]  Delivery Method: Vaginal, Spontaneous(Filed from Delivery Summary)     Pateint had an uncomplicated postpartum course.  She is ambulating, tolerating a regular diet, passing flatus, and urinating well. Patient is discharged home in stable condition on 06/22/19.   Physical exam  Vitals:   06/17/19 0515 06/17/19 1459 06/17/19 2054 06/18/19 0624  BP: 117/64 117/61 129/80 112/65  Pulse: 92 93 92 68  Resp: 18 16 18 18   Temp: 98.6 F (37 C) 99 F (37.2 C) 98.1 F (36.7 C) 98.7 F (37.1 C)  TempSrc: Oral Oral Oral Oral  SpO2: 99% 100% 98% 97%  Weight:      Height:       General: alert, cooperative and no distress Lochia: appropriate Uterine Fundus: firm Incision: N/A DVT Evaluation: No evidence of DVT seen on physical exam. Labs: Lab Results  Component Value Date   WBC 12.0 (H) 06/17/2019   HGB 10.3 (L) 06/17/2019   HCT 31.8 (L) 06/17/2019   MCV 92.2  06/17/2019   PLT 236 06/17/2019   No flowsheet data found.  Discharge instruction: per After Visit Summary and "Baby and Me Booklet".  After visit meds:  Allergies as of 06/18/2019   No Known Allergies     Medication List    STOP taking these medications   ferrous sulfate 325 (65 FE) MG tablet     TAKE these medications   ibuprofen 600 MG tablet Commonly known as: ADVIL Take 1 tablet (600 mg total) by mouth every 6 (six) hours.   PRENATAL VITAMIN PO Take by mouth.   traMADol 50 MG tablet Commonly known as: ULTRAM Take 1 tablet (50 mg total) by mouth every 6 (six) hours as needed for moderate pain.       Diet: routine diet  Activity: Advance as tolerated. Pelvic rest for 6  weeks.   Outpatient follow up:6 weeks Follow up Appt:No future appointments. Follow up Visit:No follow-ups on file.  Postpartum contraception: Nexplanon  Newborn Data: Live born female  Birth Weight: 8 lb 10 oz (3912 g) APGAR: 7, 9  Newborn Delivery   Birth date/time: 06/16/2019 03:27:00 Delivery type: Vaginal, Spontaneous      Baby Feeding: Breast Disposition:home with mother   06/22/2019 Sharon SellerJennifer M Milta Croson, DO

## 2019-06-25 ENCOUNTER — Ambulatory Visit: Payer: Self-pay

## 2019-06-25 NOTE — Lactation Note (Signed)
This note was copied from a baby's chart. Lactation Consultation Note  Patient Name: Sara Malone BJYNW'GToday's Date: 06/25/2019    06/25/2019  Name: Sara Malone MRN: [id number] Date of Birth: 06/16/2021 Gestational Age: Gestational Age: 4971w3d Birth Weight: 138 oz Weight today:    8 pounds 5.8 ounces (3792 grams) with clean newborn diaper   9 day old [name] presents today with mom and dad for feeding assessment. Infant is not latching well to the breast. Mom would like infant to BF. Mom's milk supply is not adequate to feed infant.   Infant has gained 282 grams in the last 7 days with an average daily weight gain of 40 grams a day. Infant is 120 grams below birth weight   Infant self awakens about every 2-3 hours to feed. He has been bottle feeding with a Nuk Natural Flow bottle and nipple. Parents reports infant feeds slowly and does not choke or drool on the bottle.   Mom is pumping about every 4 hours and has increased to 15-30 ml on the left breast and 5 ml on the right breast. Mom reports she did feel very engorged and milk was not flowing in the hospital despite pumping every 1-2 hours in the hospital.She used ice and cabbage leaves in the hospital as well as reverse pressure.  The amount mom has now is the most mom has been able to pump out. LC suspecting possible partial involution. Mom without other risk factors for low milk supply. Reviewed with mom that it is recommended that she pump about 8-12 x in 24 hours to maximize milk supply. Showed mom how to use the # 24 Nipple Shield and 5 french feeding tube at the breast to stimulate milk production. Reviewed trying Mother Love More Milk Plus to stimulate milk production after speaking with OB. Mom given information on Reglan to speak with OB about if mom wants. She is aware it can have side effects and not all OB's will prescribe.   Infant latched to the left breast in the cross cradle hold. Infant was on the top half of the nipple  and kept coming on and off. Infant was then relatched with a # 24 NS and a 5 french feeding tube. Infant then fed for about 20 minutes and transferred mainly from the tube. Mom had pumped 1 hour prior to appt.   Mom shown how to use 5 french feeding tube and to clean it.    Mom with lumps to her right breast. Discussed massage with pumping, sunflower Lecithin and dosage and theraputic breast massage. Discussed talking with Dr. Charlotta Newtonzan if not resolving in a week or so to rule out abscesses. Mom with no fever or flu like symptoms.   Infant to follow up with Dr. Nash DimmerQuinlan today. [facility]. Parents to call with questions/concerns as needed.      General Information: Mother's reason for visit: Feeding assessment, not latching to the breast Consult: Initial Lactation consultant: Noralee StainSharon Lainie Daubert RN,IBCLC Breastfeeding experience: not latching although trying   Maternal medications: Pre-natal vitamin, Motrin (ibuprofen)  Breastfeeding History: Frequency of breast feeding: not latching    Supplementation: Supplement method: bottle(Nuk Natural Flow) Brand: Similac Formula volume: 3-4 ounces Formula frequency: every 2-3 hours   Breast milk volume: 15-30 cc Breast milk frequency: 4 x a day   Pump type: Spectra Pump frequency: every 4 hours Pump volume: 15-35 ml  Infant Output Assessment: Voids per  24 hours: 8 Urine color: Clear yellow Stools per 24 hours: 1 Stool color: Yellow  Breast Assessment: Breast: Soft, Compressible Nipple: Erect, Cracked, Scabs Pain level: 6 Pain interventions: Bra, Expressed breast milk, Coconut oil, Breast pump, Nipple shield, Other(Boob ease)  Feeding Assessment: Infant oral assessment: Variance Infant oral assessment comment: Infant with thick labial frenulum that inserts at the bottom of the gum ridge, upper lip flanges well on the breast. infant with posterior lingual frenulum noted. He is not able to  sustain latch at the breast wihtout the nipple shield. would like to reassess at next feeding Positioning: Cross cradle(left breast, 15 minutes) Latch: 1 - Repeated attempts needed to sustain latch, nipple held in mouth throughout feeding, stimulation needed to elicit sucking reflex. Audible swallowing: 1 - A few with stimulation Type of nipple: 2 - Everted at rest and after stimulation Comfort: 2 - Soft/non-tender Hold: 1 - Assistance needed to correctly position infant at breast and maintain latch LATCH score: 7 Latch assessment: Deep Lips flanged: Yes Suck assessment: Displays both Tools: Nipple shield 24 mm, Syringe with 5 Fr feeding tube Pre-feed weight: 3792 grams Post feed weight: 3826 grams Amount transferred: 4 ml Amount supplemented: 22 ml EBM and 18 ml Formula via 5 french feeding tube  Additional Feeding Assessment: Infant oral assessment: Variance Infant oral assessment comment: see above Positioning: Cross cradle(right breast, 5 minutes) Latch: 1 - Repeated attempts neede to sustain latch, nipple held in mouth throughout feeding, stimulation needed to elicit sucking reflex. Audible swallowing: 1 - A few with stimulation Type of nipple: 2 - Everted at rest and after stimulation Comfort: 2 - Soft/non-tender Hold: 2 - No assistance needed to correctly position infant at breast LATCH score: 8 Latch assessment: Deep Lips flanged: Yes Suck assessment: Displays both Tools: Nipple shield 24 mm, Syringe with 5 Fr feeding tube Pre-feed weight: 3826 grams Post feed weight: 3832 grams Amount transferred: 0 ml Amount supplemented: 4 ml  Totals: Total amount transferred: 4 ml Total supplement given: 44 ml Total amount pumped post feed: did not pump   Plan:  1. Offer infant the breast with feeding cues, try to breast feed at least 4 x a day 2. Keep infant awake at the breast with feeding, feed infant skin to skin 3. Massage/compress breast with feeding 4. Use the # 24  Nipple Shield with feeding as needed to sustain latch at the breast. Try without the nipple shield each day to see when infant is able to feed without it.  5. Try the # 5 french feeding tube at the breast to supplement infant as able. Use breast milk or formula in the syringes 6. If infant not supplemented at the breast offer him a bottle of expressed breast milk or formula after breast feeding.  7. Feed infant using paced bottle feeding (video on [email or web address]). continue to use your Nuk Natural Flow bottles/nipples 8. Infant needs about 69-93 ml (2.5-3 ounces) for 8 feedings a day or 555-740 ml (19-25 ounces) in 24 hours. Infant may take more or less depending on how often he feeds. Feed infant until he is satisfied. 9. Would recommend that you pump 8-12 x a day,  including after breast feeding for 15-20 minutes with your Spectra pump to protect you milk supply. Massage breasts with pumping. My be helpful to use a hands free bra with pumping. Hand express post pumping Try reverse pressure before you pump. Theraputic breast massage may be very helpful to you 10. All Purpose Nipple Ointment  may be very helpful to healing your nipples. It is ordered by your OB and is made [facility] [place] [facility] 11. Talk with OB about trying Mother Love More Milk Special Blend to help with milk supply 12. Sunflower Lecithin may by helpful. It is 1200 mg capsules and recommended to take 4 x a day to start with 13. Reglan may be helpful to increase your milk supply. Reglan is prescribed by your OB and the usual dosage is 10 mg three times a day for 10-30 days 14. Keep up the good work 15. Thank you for allowing me to assist you today 16. Please call with any questions/concerns as needed [phone] 17. Follow up with Lactation in 2 weeks   Ed BlalockSharon S Rhealynn Myhre RN, IBCLC                                                     Sara FloodSharon S Jendayi Berling 06/25/2019,  9:20 AM

## 2019-07-23 IMAGING — US US MFM OB FOLLOW UP
1 series · 14 of 28 positions shown · non-contrast
Comparison: none

[Series 1: us mfm ob follow up · 14 of 77 slices shown]
[im 3/77]
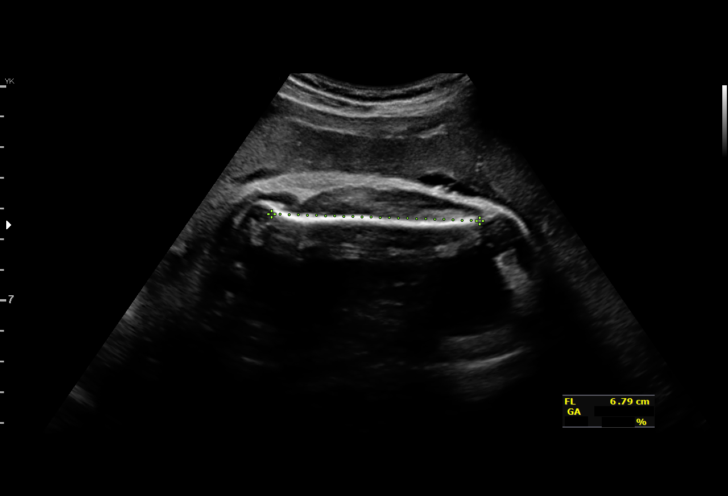
[im 9/77]
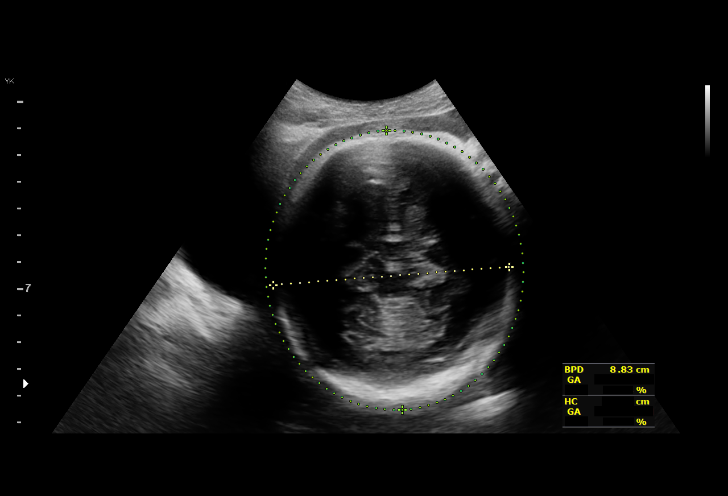
[im 15/77]
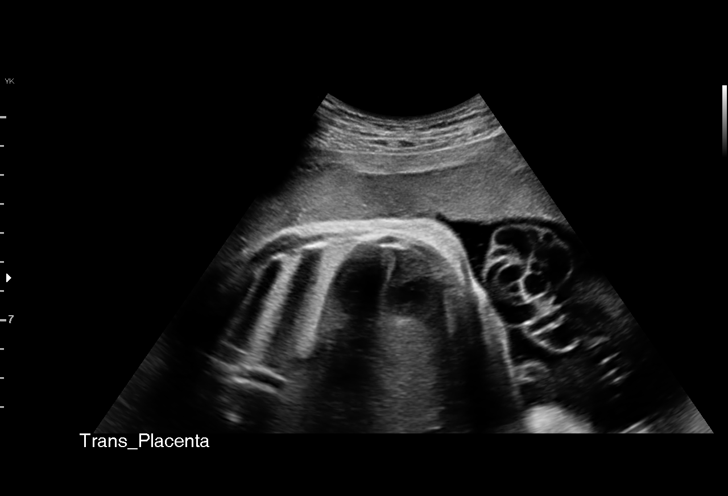
[im 20/77]
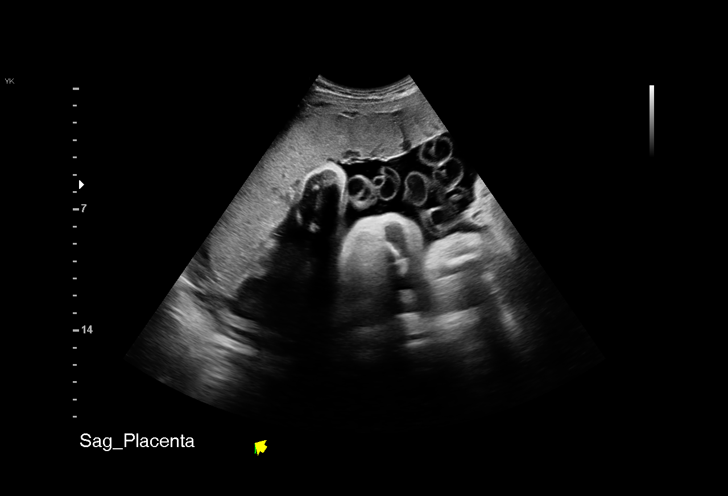
[im 26/77]
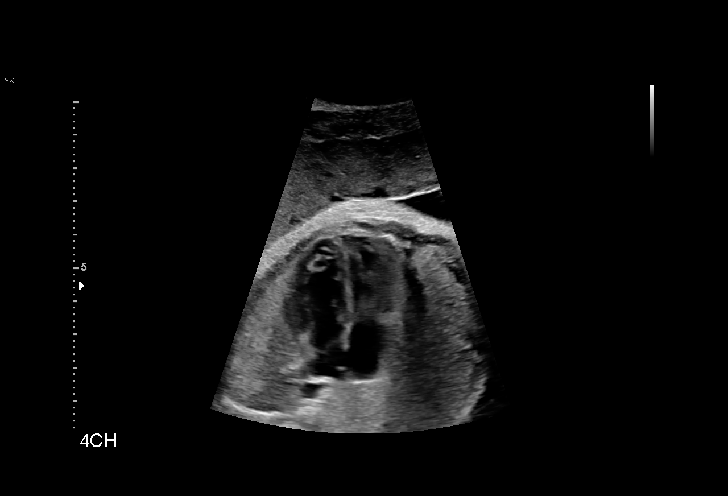
[im 31/77]
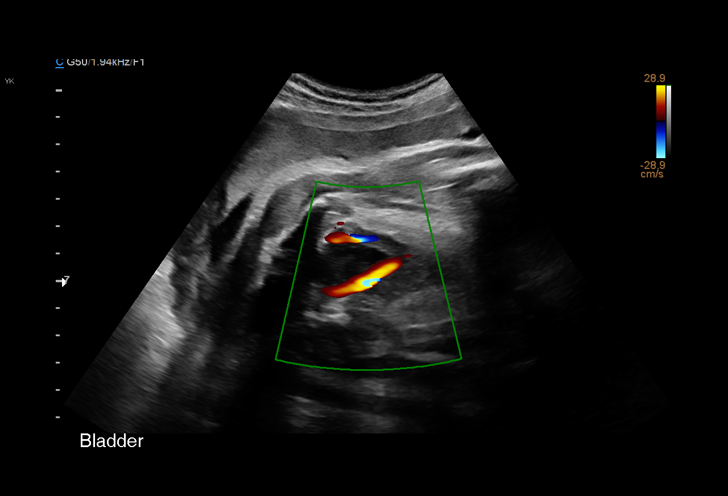
[im 37/77]
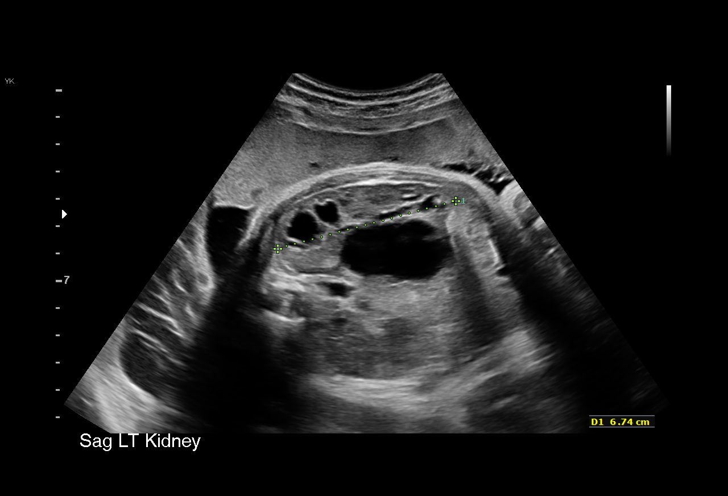
[im 43/77]
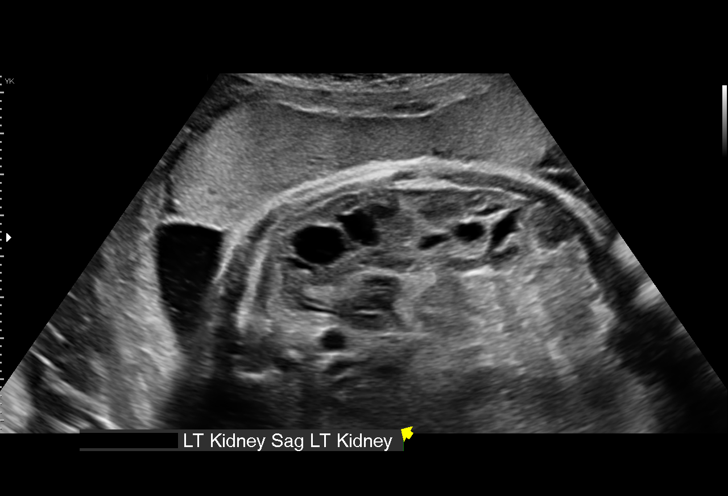
[im 48/77]
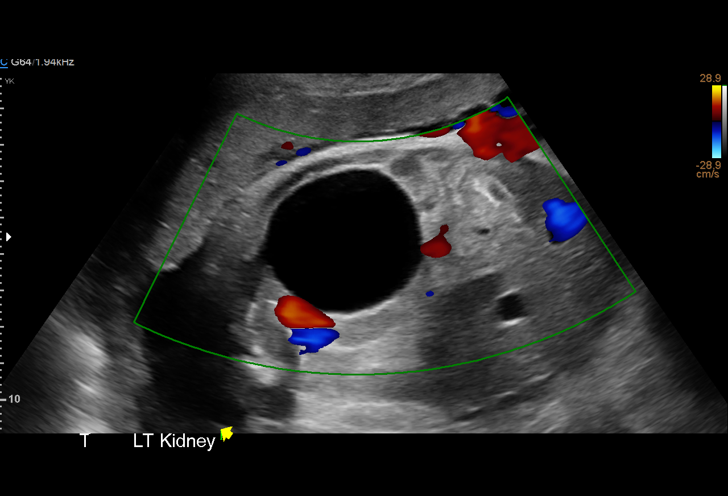
[im 54/77]
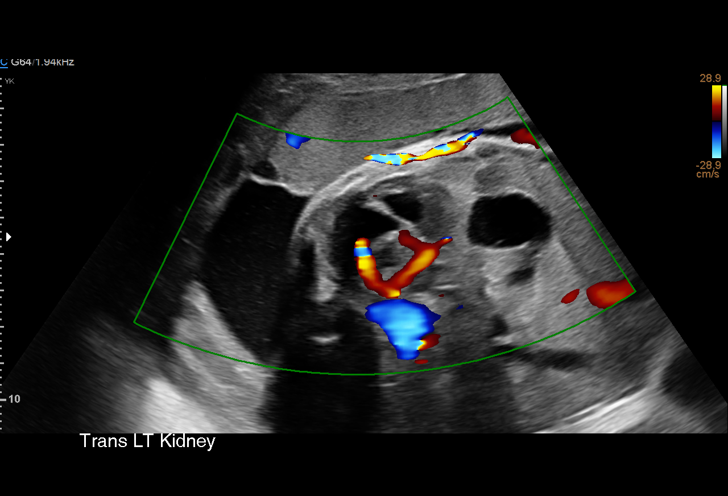
[im 60/77]
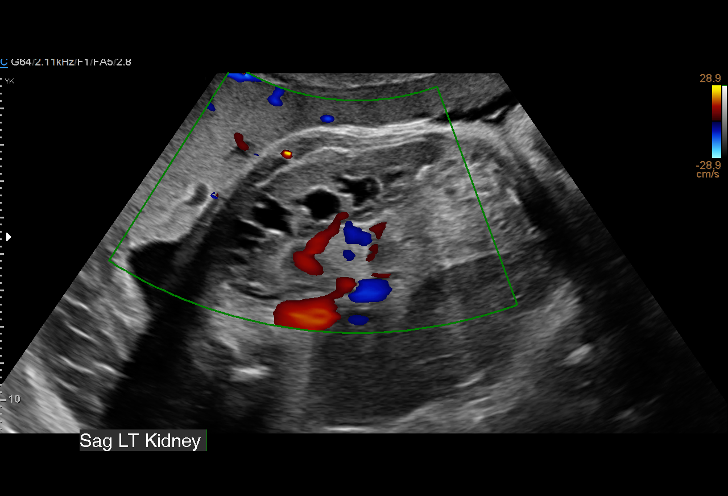
[im 65/77]
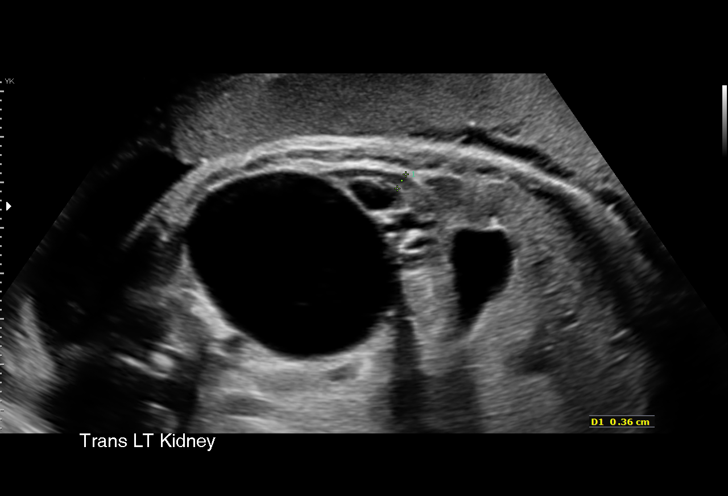
[im 71/77]
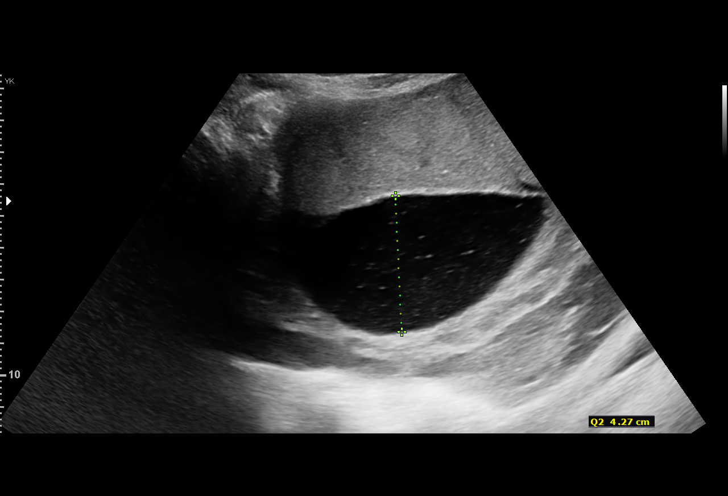
[im 77/77]
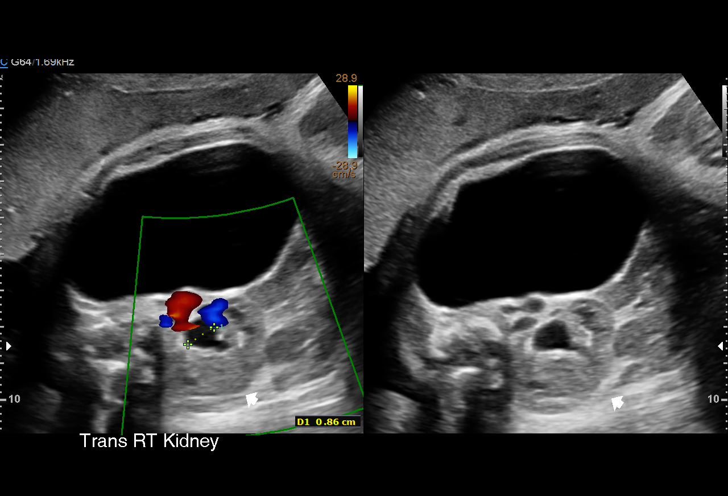

[14 of 28 positions shown; findings below may reference images not displayed]

----------------------------------------------------------------------

 ----------------------------------------------------------------------
Indications

  Fetal abnormality - other known or
  suspected (left renal cyst) quad-neg
  33 weeks gestation of pregnancy
 ----------------------------------------------------------------------
Fetal Evaluation

 Num Of Fetuses:         1
 Fetal Heart Rate(bpm):  145
 Cardiac Activity:       Observed
 Presentation:           Cephalic
 Placenta:               Anterior
 P. Cord Insertion:      Visualized, central

 Amniotic Fluid
 AFI FV:      Within normal limits

 AFI Sum(cm)     %Tile       Largest Pocket(cm)
 16.95           62          5

 RUQ(cm)       RLQ(cm)       LUQ(cm)        LLQ(cm)
 5
Biometry

 BPD:        84  mm     G. Age:  33w 6d         57  %    CI:        69.59   %    70 - 86
                                                         FL/HC:      21.1   %    19.9 -
 HC:      321.4  mm     G. Age:  36w 2d         83  %    HC/AC:      1.03        0.96 -
 AC:      310.6  mm     G. Age:  35w 0d         90  %    FL/BPD:     80.8   %    71 - 87
 FL:       67.9  mm     G. Age:  34w 6d         77  %    FL/AC:      21.9   %    20 - 24

 Est. FW:    5099  gm    5 lb 10 oz      82  %
OB History

 Gravidity:    2         Term:   1        Prem:   0        SAB:   0
 TOP:          0       Ectopic:  0        Living: 1
Gestational Age

 LMP:           33w 3d        Date:  09/20/18                 EDD:   06/27/19
 U/S Today:     35w 0d                                        EDD:   06/16/19
 Best:          33w 3d     Det. By:  LMP  (09/20/18)          EDD:   06/27/19
Anatomy

 Cranium:               Previously seen        LVOT:                   Previously seen
 Cavum:                 Previously seen        Aortic Arch:            Previously seen
 Ventricles:            Previously seen        Ductal Arch:            Previously seen
 Choroid Plexus:        Previously seen        Diaphragm:              Previously seen
 Cerebellum:            Previously seen        Stomach:                Appears normal, left
                                                                       sided
 Posterior Fossa:       Previously seen        Abdomen:                Previously seen
 Nuchal Fold:           Not applicable (>20    Abdominal Wall:         Appears nml (cord
                        wks GA)                                        insert, abd wall)
 Face:                  Appears normal         Cord Vessels:           Appears normal (3
                        (orbits and profile)                           vessel cord)
 Lips:                  Appears normal         Kidneys:                Bilat pyelectasis
 Palate:                Previously seen        Bladder:                Appears normal
 Thoracic:              Appears normal         Spine:                  Previously seen
                        Previously seen
 Heart:                 Appears normal         Upper Extremities:      Previously seen
                        (4CH, axis, and
                        situs)
 RVOT:                  Previously seen        Lower Extremities:      Previously seen

 Other:  Heels and open hands/5th digits previously visualized.
Impression

 Unilateral pyelectasis again seen. The left renal pelvis not
 well seen given adjacent cyst. Enlarged simple cyst
 suspected to renal vs mesenteric in origin. 6 x 4 x 4.3 cm.
 Normal amniotic fluid and growth today.
Recommendations

 Follow up growth scheduled in 4 weeks.
